# Patient Record
Sex: Male | Born: 1995 | Race: Black or African American | Hispanic: No | Marital: Single | Smoking: Never smoker
Health system: Southern US, Community
[De-identification: ages and names within clinical notes are randomized; demographics above are authoritative.]

---

## 2014-03-01 ENCOUNTER — Emergency Department (HOSPITAL_COMMUNITY)
Admission: EM | Admit: 2014-03-01 | Discharge: 2014-03-01 | Disposition: A | Discharge status: Home / Self Care | Payer: Medicaid Other | Attending: Emergency Medicine | Admitting: Emergency Medicine

## 2014-03-01 ENCOUNTER — Emergency Department (HOSPITAL_COMMUNITY): Payer: Medicaid Other

## 2014-03-01 ENCOUNTER — Encounter (HOSPITAL_COMMUNITY): Payer: Self-pay

## 2014-03-01 DIAGNOSIS — R079 Chest pain, unspecified: Secondary | ICD-10-CM | POA: Diagnosis present

## 2014-03-01 DIAGNOSIS — R0789 Other chest pain: Secondary | ICD-10-CM | POA: Diagnosis not present

## 2014-03-01 LAB — BASIC METABOLIC PANEL
ANION GAP: 13 (ref 5–15)
BUN: 8 mg/dL (ref 6–23)
CHLORIDE: 101 meq/L (ref 96–112)
CO2: 27 meq/L (ref 19–32)
Calcium: 9.7 mg/dL (ref 8.4–10.5)
Creatinine, Ser: 1.06 mg/dL (ref 0.50–1.35)
GFR calc Af Amer: >90 mL/min (ref >90)
GFR calc non Af Amer: >90 mL/min (ref >90)
GLUCOSE: 80 mg/dL (ref 70–99)
POTASSIUM: 4.2 meq/L (ref 3.7–5.3)
SODIUM: 141 meq/L (ref 137–147)

## 2014-03-01 LAB — CBC
HCT: 43.9 % (ref 39.0–52.0)
Hemoglobin: 15.1 g/dL (ref 13.0–17.0)
MCH: 30.4 pg (ref 26.0–34.0)
MCHC: 34.4 g/dL (ref 30.0–36.0)
MCV: 88.3 fL (ref 78.0–100.0)
PLATELETS: 247 10*3/uL (ref 150–400)
RBC: 4.97 MIL/uL (ref 4.22–5.81)
RDW: 12.4 % (ref 11.5–15.5)
WBC: 6.5 10*3/uL (ref 4.0–10.5)

## 2014-03-01 LAB — I-STAT TROPONIN, ED: TROPONIN I, POC: 0 ng/mL (ref 0.00–0.08)

## 2014-03-01 MED ORDER — KETOROLAC TROMETHAMINE 60 MG/2ML IM SOLN
30.0000 mg | Freq: Once | INTRAMUSCULAR | Status: AC
  Administered 2014-03-01: 30 mg via INTRAMUSCULAR
  Filled 2014-03-01: qty 2

## 2014-03-01 MED ORDER — METHOCARBAMOL 500 MG PO TABS
500.0000 mg | ORAL_TABLET | Freq: Once | ORAL | Status: AC
  Administered 2014-03-01: 500 mg via ORAL
  Filled 2014-03-01: qty 1

## 2014-03-01 MED ORDER — METHOCARBAMOL 500 MG PO TABS: 1000.0000 mg | ORAL_TABLET | Freq: Four times a day (QID) | ORAL | Status: DC | PRN

## 2014-03-01 NOTE — ED Notes (Addendum)
Pt. Reports woke up this AM with right sided right and chest pain with mild SOB. States pain increases with deep breathing. Denies radiation. Reports recent long distance travel in car. Pt. Does not appear in distress at triage. Alert and oriented x.4.

## 2014-03-01 NOTE — Discharge Instructions (Signed)
For pain control you may take up to 800mg  of Motrin (also known as ibuprofen). That is usually 4 over the counter pills,  3 times a day. Take with food to minimize stomach irritation   You can also take  tylenol (acetaminophen) 975mg  (this is 3 over the counter pills) four times a day. Do not drink alcohol or combine with other medications that have acetaminophen as an ingredient (Read the labels!).    For breakthrough pain you may take Robaxin. Do not drink alcohol, drive or operate heavy machinery when taking Robaxin.  Do not hesitate to return to the emergency room for any new, worsening or concerning symptoms.  Please obtain primary care using resource guide below. But the minute you were seen in the emergency room and that they will need to obtain records for further outpatient management.  Jack Roth.reso  Emergency Department Resource Guide 1) Find a Doctor and Pay Out of Pocket Although you won't have to find out who is covered by your insurance plan, it is a good idea to ask around and get recommendations. You will then need to call the office and see if the doctor you have chosen will accept you as a new patient and what types of options they offer for patients who are self-pay. Some doctors offer discounts or will set up payment plans for their patients who do not have insurance, but you will need to ask so you aren't surprised when you get to your appointment.  2) Contact Your Local Health Department Not all health departments have doctors that can see patients for sick visits, but many do, so it is worth a call to see if yours does. If you don't know where your local health department is, you can check in your phone book. The CDC also has a tool to help you locate your state's health department, and many state websites also have listings of all of their local health departments.  3) Find a Walk-in Clinic If your illness is not likely to be very severe or complicated, you may want to try a walk  in clinic. These are popping up all over the country in pharmacies, drugstores, and shopping centers. They're usually staffed by nurse practitioners or physician assistants that have been trained to treat common illnesses and complaints. They're usually fairly quick and inexpensive. However, if you have serious medical issues or chronic medical problems, these are probably not your best option.  No Primary Care Doctor: - Call Health Connect at  [phone] - they can help you locate a primary care doctor that  accepts your insurance, provides certain services, etc. - Physician Referral Service- [phone]  Chronic Pain Problems: Organization         Address  Phone   Notes  [facility]  [phone] Patients need to be referred by their primary care doctor.   Medication Assistance: Organization         Address  Phone   Notes  [facility] [address] [place], [place] [phone] --Must be a resident of [place] [facility] [place] -- Must have NO insurance coverage whatsoever (no Medicaid/ Medicare, etc.) -- The pt. MUST have a primary care doctor that directs their care regularly and follows them in the community   [facility]  [phone]   [employer]  [phone]    Agencies that provide inexpensive medical care: Retail buyerrganization         Address  Phone  Notes  °[facility]  ([phone]) [phone]   °[facility]    (336) 832-7272   °[facility] [address] °[place], [place] [address] (336) 832-4777   °[facility] [address], °[place] (336) 271-4999   °[facility]    (336) 373-0678   °[facility]    (336) 272-1050   °[facility] ° [address], [place] Phone:  (336) [phone], Fax:  (336) [phone] Hours of Operation:  9 am - 6 pm, M-F.  Also accepts Medicaid/Medicare and self-pay.  °[facility] ° [address], [place] Phone: (336) [phone], Fax: ([phone]) [phone]. Hours of Operation:  8:30 am - 5:30 pm, M-F.  Also accepts Medicaid and self-pay.  °[facility] [place] [address], [place] Phone: (336) [phone]   °[facility] [address], [place], [place] (336)723-1848, Ext. 123 Mondays & Thursdays: 7-9 AM.  First 15 patients are seen on a first come, first serve basis. °  ° °Medicaid-accepting [place] Providers: ° °Organization         Address  Phone   Notes  °[facility] [address], [place] ([phone]) [phone] Also accepts self-pay patients.  °[facility] [address], [place] ° (336) [phone]   °[facility] [address], [place] (336) [phone]   °[facility] [address] [place] [address], [place] (336) 299-7000   °Veita Bland [address], [place]  ° (336) [phone] Only accepts [place] Access Medicaid patients after they have their name applied to their card.  ° °Self-Pay (no insurance) in [place]: ° °Organization         Address  Phone   Notes  °Sickle Cell Patients, [facility] [address], [place] (336) 832-1970   °[facility] [address], [place] (336) 832-4400   °[facility] ° [address], [place] (336) 992-4800   °[facility]/Dr. Osei-Bonsu ° [address] [place] [address], [place] or [address], [place] (336) [phone] Phone number for both [place] and [place] locations is the same.  °[facility] [address], [place] (336) [phone]   °[facility] [address] [place] [address], [place] or [address] (336) 852-7530 °(336) 878-2260   °[facility] [address],  (336) 350-1642, phone; (336) [phone], fax Sees  patients 1st and 3rd Saturday of every month.  Must not qualify for public or private insurance (i.e. Medicaid, Medicare, [place] Health Choice, Veterans' Benefits) • Household income should be no more than 200% of the poverty level •The clinic cannot treat you if you are pregnant or think you are pregnant • Sexually transmitted diseases are not treated at the clinic.  ° ° °Dental Care: °Organization         Address  Phone  Notes  °[place] [facility] [address],  (336) [phone] Accepts children up to age 21 who are enrolled in Medicaid or [place] Health Choice; pregnant women with a Medicaid card; and children who have applied for Medicaid or [place] Health Choice, but were declined, whose parents can pay a reduced fee at time of service.  °[place]   Department of [facility]  [address], [place] [phone] Accepts children up to age 36 who are enrolled in [place] or [place]; pregnant women with a Medicaid card; and children who have applied for Medicaid or [place] Health Choice, but were declined, whose parents can pay a reduced fee at time of service.  [facility]  [place] [phone] Patients are seen by appointment only. Walk-ins are not accepted. [place] will see patients 32 years of age and older. Monday - Tuesday (8am-5pm) Most Wednesdays (8:30-5pm) $30 per visit, cash only  [facility]  [address], [facility] [phone] Patients are seen by appointment only. Walk-ins are not accepted. [place] will see patients 82 years of age and older. One Wednesday Evening (Monthly: Volunteer Based).  $30 per visit, cash only  [place]  [phone] for adults; Children under age 20, call Graduate Pediatric Dentistry at [phone]. Children aged 74-14, please call [phone] to request a  pediatric application.  Dental services are provided in all areas of dental care including fillings, crowns and bridges, complete and partial dentures, implants, gum treatment, root canals, and extractions. Preventive care is also provided. Treatment is provided to both adults and children. Patients are selected via a lottery and there is often a waiting list.   [facility] [address], [place]  [phone] [email or web address]   [facility] [address] [place], [place] [phone], Ext. 123 Second and Fourth Thursday of each month, opens at 6:30 AM; Clinic ends at 9 AM.  Patients are seen on a first-come first-served basis, and a limited number are seen during each clinic.   [facility]  [address] Hillard Danker [place], [place] [phone]   Eligibility Requirements You must have lived in [place], [place], or [place] counties for at least the last three months.   You cannot be eligible for state or federal sponsored [employer], including [employer], [place], or [employer].   You generally cannot be eligible for healthcare insurance through your employer.    How to apply: Eligibility screenings are held every Tuesday and Wednesday afternoon from 1:00 pm until 4:00 pm. You do not need an appointment for the interview!  [facility] [address], [place], [place]   [place]  [place] Department  [place]  [phone]    Behavioral Health Resources in the Community: Intensive Outpatient Programs Organization         Address  Phone  Notes  [facility] [address], [place], [place] [phone]   [facility] [address] [place], [place]   [facility] [address] [place], [place]   [place] [address],  [place], [place] or (217)[phone]   Substance Abuse Resources Organization         Address  Phone  Notes  [facility]  [phone]   [place]  [phone]   [facility] [place]   Chinita Pester  [phone]   Residential & Outpatient Substance Abuse Program  (479)[phone]   Psychological Services Organization         Address  Phone  Notes  [facility]  336- [phone]   °[facility]  336- 378-7881   °[facility] [address], [place] 1-800-853-5163 or 336-641-4981   ° °Mobile Crisis Teams °Organization         Address  Phone  Notes  °[facility], Mobile Crisis Care Unit  1-877-626-1772   °[facility]ive °[facility] ° [address] [place], [place] 336-834-9664   °Sharon DeEsch [address] °[place] 336-554-5454   ° °Self-Help/Support Groups °Organization         Address  Phone             Notes  °[facility] [place] - variety of support groups  336- 373-1402 Call for more information  °Narcotics Anonymous (NA), [facility] [address], °[place]  2 meetings at this location  ° °Residential Treatment Programs °Organization         Address  Phone  Notes  °[facility] [address],    °[place]  1-866-801-8205   °[facility] ° [address], [place], [place] [phone]   °[facility] [address], [place] [phone] Admissions: 8am-3pm M-F  °[facility] [address]    °[place], [place] 336-841-1104   °[facility] [address], [place], [place] 336-379-7146   °[facility] [address]  °[place], [place] 336-285-9073   °[facility] - Intensive Outpatient [address], [place], [place] 336-852-3033   °[facility] [address]    °[place], [place] 1-877-615-2722 or 336-784-9470   °[facility] ([facility]) [address] [place], [place] 336-227-7417 Accepts Medicaid  °[facility] [address]  °[place] 1-800-659-3381 Substance Abuse/Addiction Treatment  ° °[place] Behavioral Health Resources °Organization         Address  Phone  Notes  °[facility]  (888) 581-9988   °Julie Brannon, PhD [address] [place], [place]   (336) 349-5553 or (336) 951-0000   °[facility]   [address] °[place], [place] (336) 349-4454   °[facility] [address], [place], [place] (336) 342-8316 Insurance/Medicaid/sponsorship through [facility]  °[facility] [address]                                    [place], [place] (336) 342-8316 Therapy/tele-psych/case  °[facility] [address]  ° [place], [place] (336) 349-2233    °Dr. Arfeen  (336) 349-4544   °[facility] [place]  [employer] [place] [facility] 1) [address], [place] °2) [address], [place] °3)  [address], [place] (336) 349-3220 °(336) 342-7768 ° °(336) 342-8140   °[place] Child Abuse Hotline (336) 342-1394 or (336) 342-3537 (After Hours)    ° ° ° ° °

## 2014-03-01 NOTE — ED Provider Notes (Signed)
CSN: [id number]     Arrival date & time 03/01/14  1552 History  This chart was scribed for Wynetta EmeryNicole Marguita Venning, PA-C, working with Elwin MochaBlair Walden, MD found by Elon SpannerGarrett Cook, ED Scribe. This patient was seen in room TR09C/TR09C and the patient's care was started at 5:00 PM.   Chief Complaint  Patient presents with  . Chest Pain   The history is provided by the patient. No language interpreter was used.   HPI Comments: Jack Roth is a 18 y.o. male who presents to the Emergency Department complaining of 7/10 central and right lateral chest pain with inspiration and expiration only onset today.    Patient reports several years of chronic nasal congestion for which he has taken a nasal spray.  Patient denies taking any other medications.  Patient denies history of similar episodes.  Patient denies history of PE, DVT.  Patient denies recent prolonged immobilization (note that this contradicts triage note, have confirmed with the patient he specifically denies recent immobilization including surgeries, long trips.).  Patient denies leg swelling/calf pain.  Patient denies new activity including heavy lifting.   Patient denies family family history of early cardiac death. Patient denies cough, fever, chills, SOB, sore througth.     History reviewed. No pertinent past medical history. History reviewed. No pertinent past surgical history. History reviewed. No pertinent family history. History  Substance Use Topics  . Smoking status: Never Smoker   . Smokeless tobacco: Not on file  . Alcohol Use: No    Review of Systems A complete 10 system review of systems was obtained and all systems are negative except as noted in the HPI and PMH.   Allergies  Review of patient's allergies indicates no known allergies.  Home Medications   Prior to Admission medications   Medication Sig Start Date End Date Taking? Authorizing Provider  methocarbamol (ROBAXIN) 500 MG tablet Take 2 tablets (1,000 mg total)  by mouth 4 (four) times daily as needed (Pain). 03/01/14   Alizee Maple, PA-C   BP 97/60 mmHg  Pulse 77  Temp(Src) 98.4 F (36.9 C) (Oral)  Resp 16  SpO2 100% Physical Exam  Constitutional: He is oriented to person, place, and time. He appears well-developed and well-nourished. No distress.  HENT:  Head: Normocephalic and atraumatic.  Eyes: Conjunctivae and EOM are normal.  Neck: Neck supple. No tracheal deviation present.  Cardiovascular: Normal rate.   Pulmonary/Chest: Effort normal and breath sounds normal. No stridor. No respiratory distress. He has no wheezes. He has no rales. He exhibits tenderness.    Abdominal: Soft.  Musculoskeletal: Normal range of motion.  No calf asymmetry, superficial collaterals, palpable cords, edema, Homans sign negative bilaterally.    Neurological: He is alert and oriented to person, place, and time.  Skin: Skin is warm and dry.  Psychiatric: He has a normal mood and affect. His behavior is normal.  Nursing note and vitals reviewed.   ED Course  Procedures (including critical care time)  DIAGNOSTIC STUDIES: Oxygen Saturation is 100% on RA, normal by my interpretation.    COORDINATION OF CARE:  5:33 PM Discussed treatment plan with patient at bedside.  Patient acknowledges and agrees with plan.    Labs Review Labs Reviewed  CBC  BASIC METABOLIC PANEL  Rosezena SensorI-STAT TROPOININ, ED    Imaging Review Dg Chest 2 View  03/01/2014   CLINICAL DATA:  Right-sided chest pain for 1 day.  EXAM: CHEST  2 VIEW  COMPARISON:  None.  FINDINGS: The heart size and  mediastinal contours are within normal limits. Both lungs are clear. The visualized skeletal structures are unremarkable.  IMPRESSION: No active cardiopulmonary disease.   Electronically Signed   By: Herbie BaltimoreWalt  Liebkemann M.D.   On: 03/01/2014 18:17     EKG Interpretation None      MDM   Final diagnoses:  Chest wall pain    Filed Vitals:   03/01/14 1605 03/01/14 1835  BP: 108/72 97/60   Pulse: 81 77  Temp: 98.4 F (36.9 C)   TempSrc: Oral   Resp: 18 16  SpO2: 100% 100%    Medications  ketorolac (TORADOL) injection 30 mg (30 mg Intramuscular Given 03/01/14 1716)  methocarbamol (ROBAXIN) tablet 500 mg (500 mg Oral Given 03/01/14 1716)    Jack Roth is a pleasant 18 y.o. male presenting with pleuritic chest pain, chest pain is also reproducible to palpation. Patient is not tachycardic, he is saturating well on room air, patient is low risk by Wells criteria and PERC negative. Doubt PE. Blood work unremarkable, EKG with no arrhythmia, chest x-ray also without acute abnormality. Will treat with Robaxin and patient will be given Toradol shot in the ED.  Evaluation does not show pathology that would require ongoing emergent intervention or inpatient treatment. Pt is hemodynamically stable and mentating appropriately. Discussed findings and plan with patient/guardian, who agrees with care plan. All questions answered. Return precautions discussed and outpatient follow up given.   Discharge Medication List as of 03/01/2014  6:31 PM    START taking these medications   Details  methocarbamol (ROBAXIN) 500 MG tablet Take 2 tablets (1,000 mg total) by mouth 4 (four) times daily as needed (Pain)., Starting 03/01/2014, Until Discontinued, Print         I personally performed the services described in this documentation, which was scribed in my presence. The recorded information has been reviewed and is accurate.   Wynetta Emeryicole Taunia Frasco, PA-C 03/02/14 0119  Elwin MochaBlair Walden, MD 03/02/14 2135

## 2014-03-01 NOTE — ED Notes (Signed)
Pt. Stated only hurts my chest when I inhale

## 2016-04-06 IMAGING — CR DG CHEST 2V
2 series · 2 of 2 positions shown · non-contrast
Comparison: None.

CLINICAL DATA: Right-sided chest pain for 1 day.

EXAM:
CHEST  2 VIEW

[chest pa]
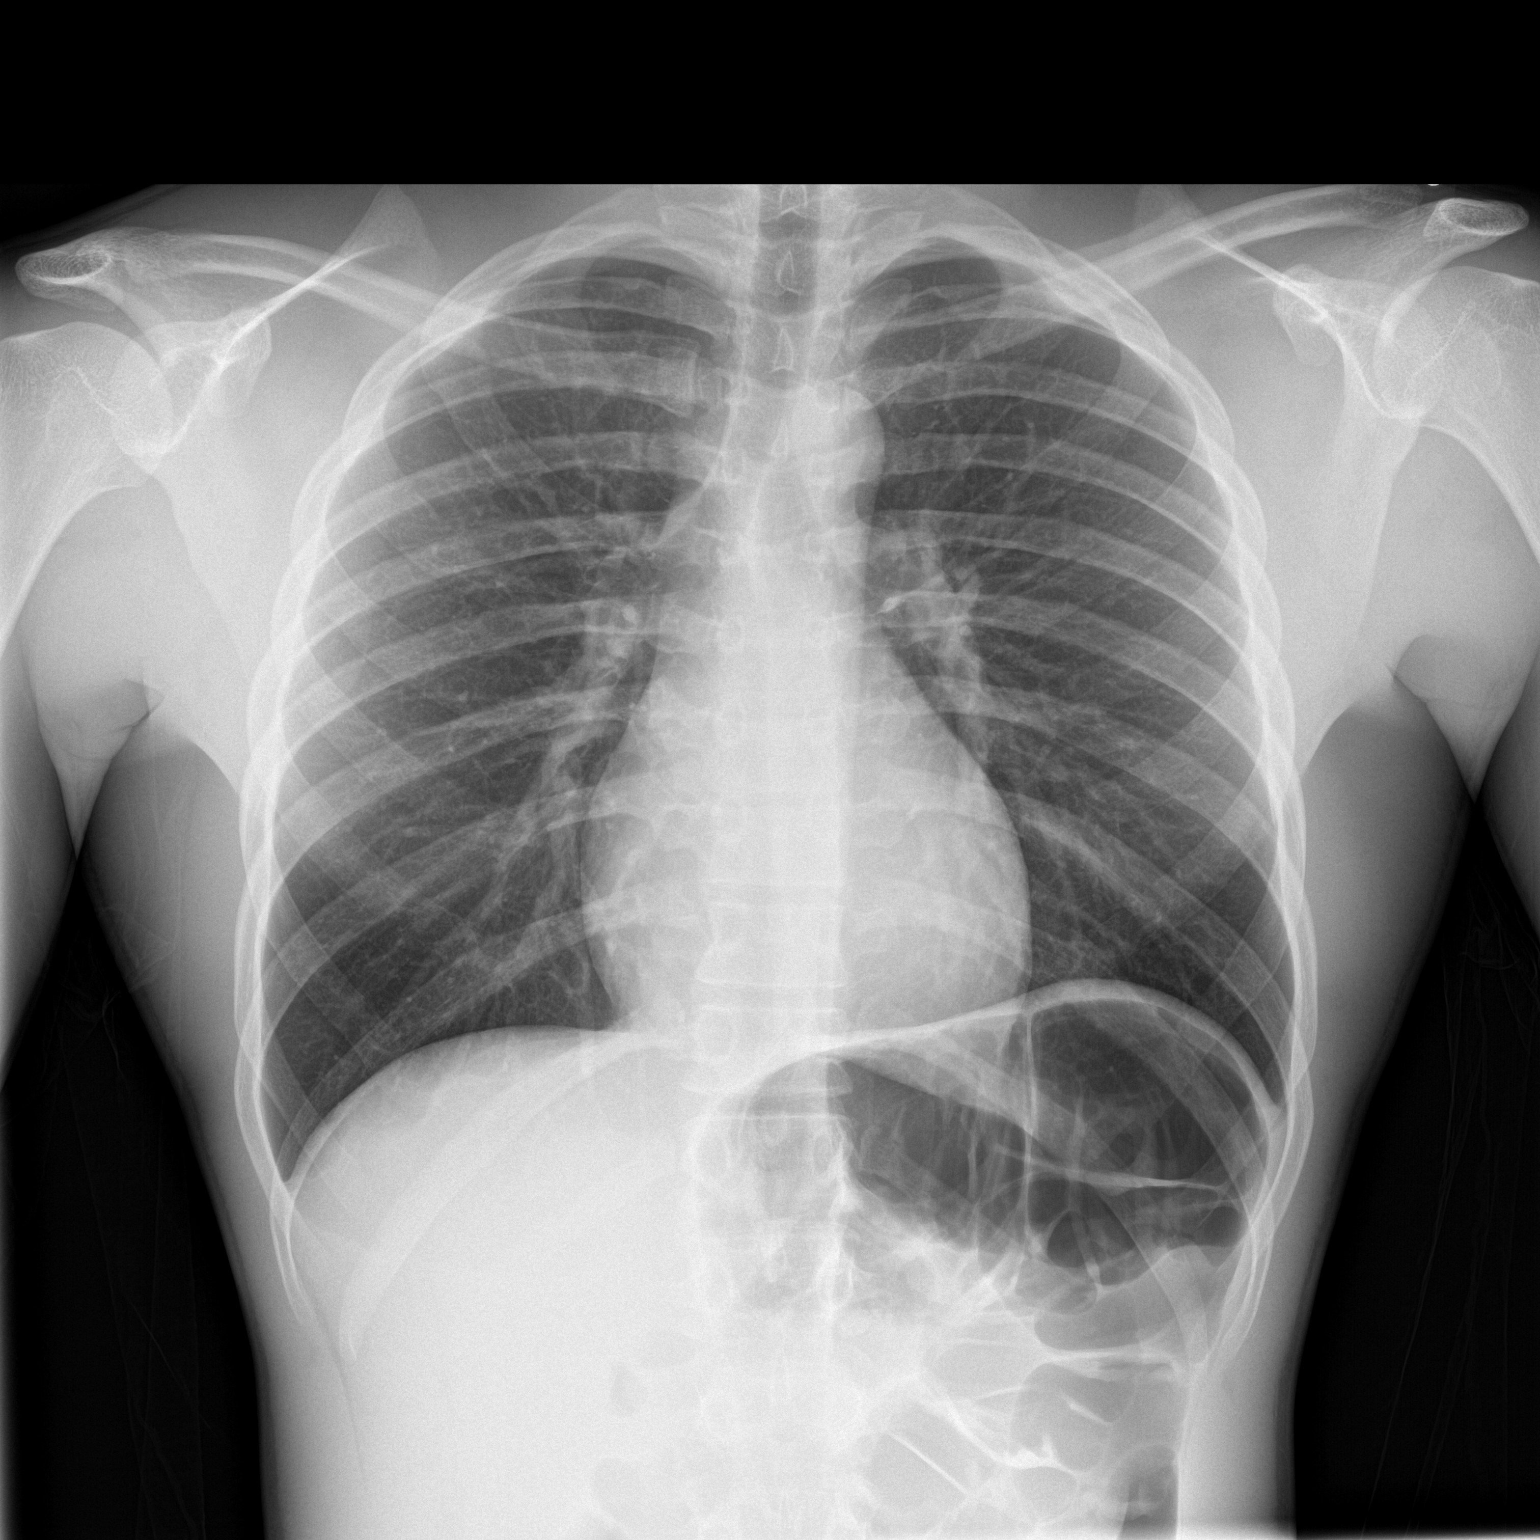

[chest lat]
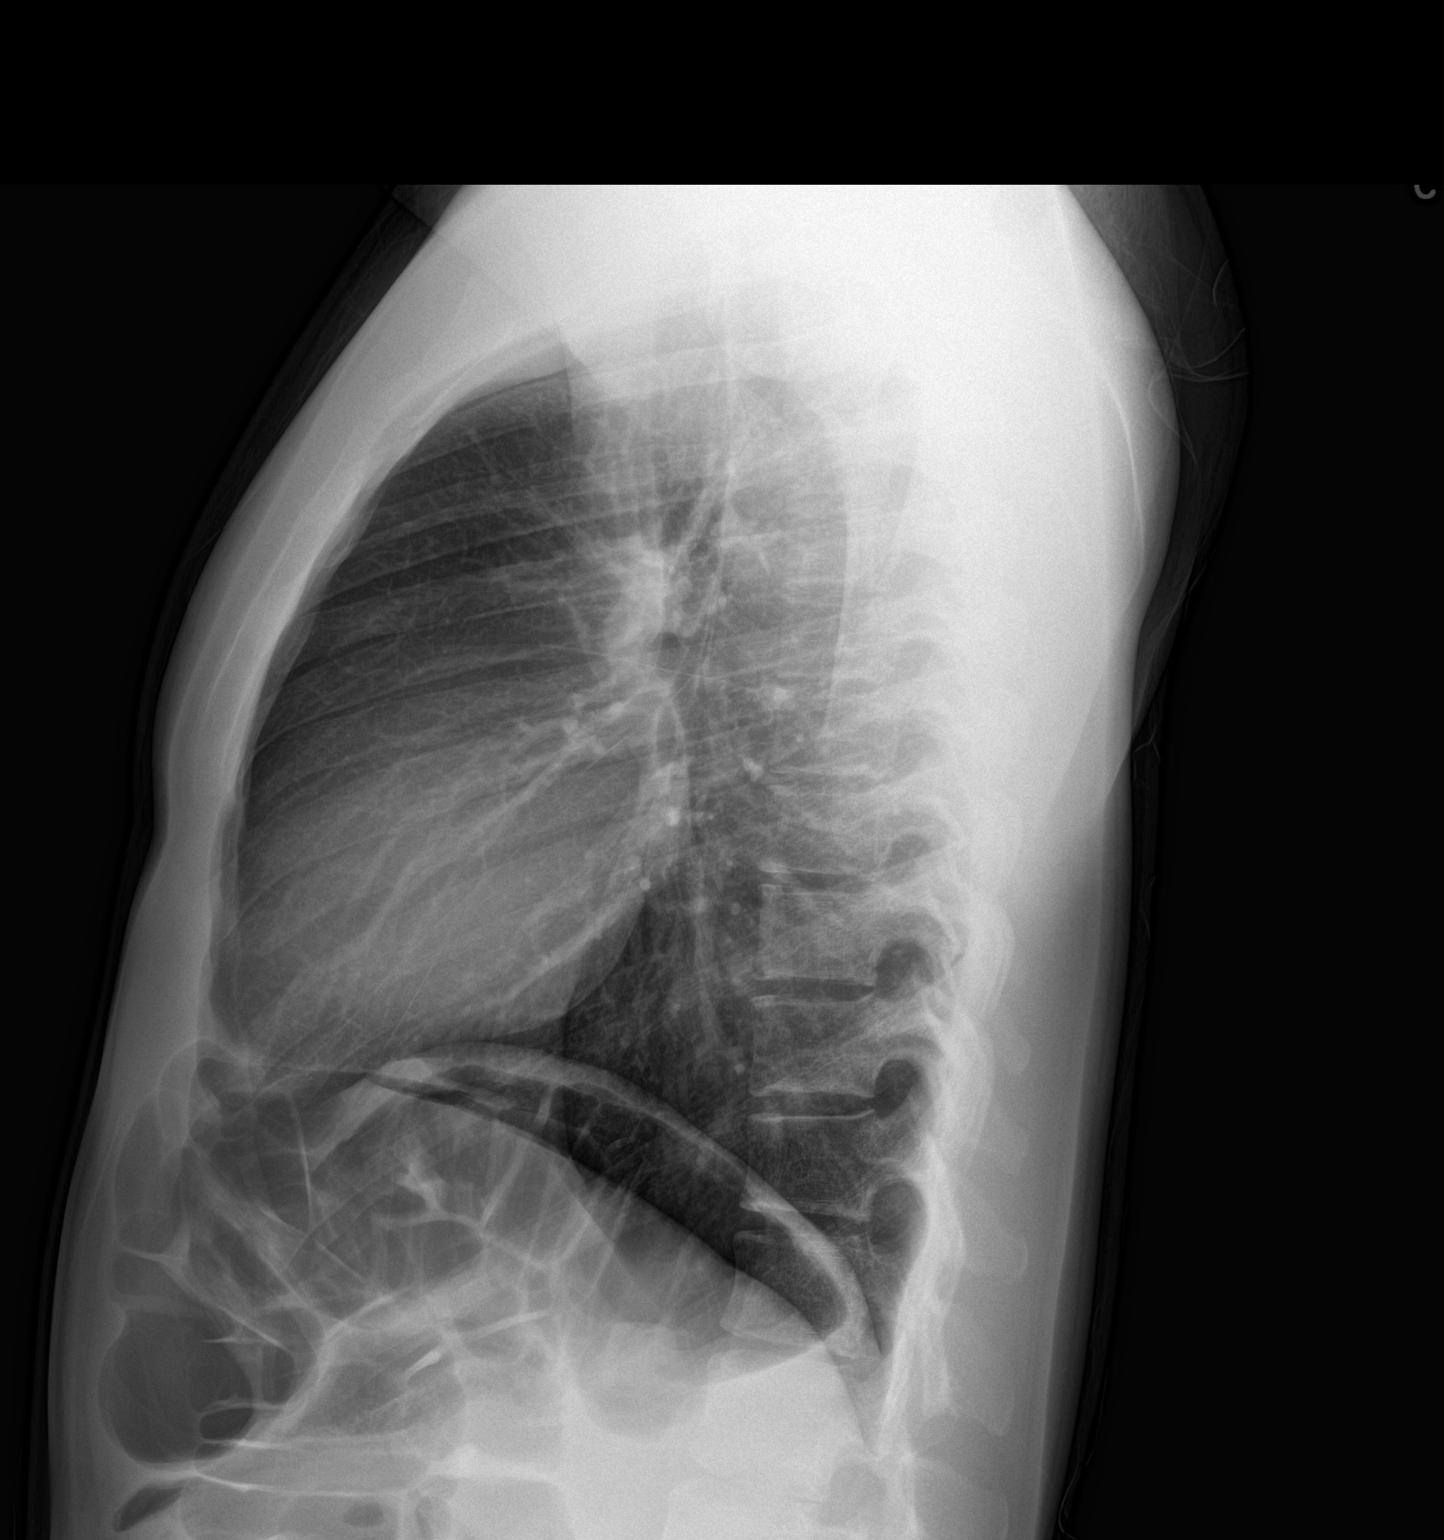

[2 of 2 positions shown; findings below may reference images not displayed]

FINDINGS: The heart size and mediastinal contours are within normal limits.
Both lungs are clear. The visualized skeletal structures are
unremarkable.
IMPRESSION: No active cardiopulmonary disease.

## 2016-12-26 ENCOUNTER — Ambulatory Visit (HOSPITAL_COMMUNITY)
Admission: EM | Admit: 2016-12-26 | Discharge: 2016-12-26 | Disposition: A | Discharge status: Home / Self Care | Payer: No Typology Code available for payment source | Attending: Family Medicine | Admitting: Family Medicine

## 2016-12-26 ENCOUNTER — Encounter (HOSPITAL_COMMUNITY): Payer: Self-pay | Admitting: Emergency Medicine

## 2016-12-26 DIAGNOSIS — J40 Bronchitis, not specified as acute or chronic: Secondary | ICD-10-CM | POA: Diagnosis not present

## 2016-12-26 MED ORDER — AZITHROMYCIN 250 MG PO TABS
250.0000 mg | ORAL_TABLET | Freq: Every day | ORAL | 0 refills | Status: AC
Start: 2016-12-26 — End: ?

## 2016-12-26 MED ORDER — CETIRIZINE-PSEUDOEPHEDRINE ER 5-120 MG PO TB12
1.0000 | ORAL_TABLET | Freq: Every day | ORAL | 0 refills | Status: AC
Start: 2016-12-26 — End: ?

## 2016-12-26 MED ORDER — FLUTICASONE PROPIONATE 50 MCG/ACT NA SUSP: 2.0000 | Freq: Every day | NASAL | 0 refills | Status: AC

## 2016-12-26 NOTE — Discharge Instructions (Signed)
Azithromycin as directed. Start flonase, zyrtec-D for nasal congestion. You can use over the counter nasal saline rinse such as neti pot for nasal congestion. Keep hydrated, your urine should be clear to pale yellow in color. Tylenol/motrin for fever and pain. Monitor for any worsening of symptoms, chest pain, shortness of breath, wheezing, swelling of the throat, follow up for reevaluation.

## 2016-12-26 NOTE — ED Triage Notes (Signed)
Cough for 3-4 weeks.  Noticed cold chills today.  Patient had a runny nose last week and also had head congestion

## 2016-12-26 NOTE — ED Provider Notes (Signed)
[facility]    CSN: [id number] Arrival date & time: 12/26/16  1027     History   Chief Complaint Chief Complaint  Patient presents with  . URI    HPI Jack Roth is a 21 y.o. male.   21 year old healthy male comes in for 3 week history of cough. States it started with sinus symptoms of congestion, headache. He has been taking otc medication with little relief. He now has had a dry cough for the past 3 weeks that is worse at night. He started having chills today and came in to be seen. Denies fever. Continues to have some congestion. Denies ear pain, eye pain, sore throat. Denies chest pain, shortness of breath, wheezing. He is a current some day smoker for the past 2 years and have had to decrease in amount due to his symptoms. Positive sick contact.       History reviewed. No pertinent past medical history.  There are no active problems to display for this patient.   History reviewed. No pertinent surgical history.     Home Medications    Prior to Admission medications   Medication Sig Start Date End Date Taking? Authorizing Provider  OVER THE COUNTER MEDICATION otc allergy medicine   Yes [provider]  azithromycin (ZITHROMAX) 250 MG tablet Take 1 tablet (250 mg total) by mouth daily. Take first 2 tablets together, then 1 every day until finished. 12/26/16   Cathie Hoops, Amy V, PA-C  cetirizine-pseudoephedrine (ZYRTEC-D) 5-120 MG tablet Take 1 tablet by mouth daily. 12/26/16   Cathie Hoops, Amy V, PA-C  fluticasone (FLONASE) 50 MCG/ACT nasal spray Place 2 sprays into both nostrils daily. 12/26/16   Belinda Fisher, PA-C    Family History No family history on file.  Social History Social History  Substance Use Topics  . Smoking status: Never Smoker  . Smokeless tobacco: Not on file  . Alcohol use No     Allergies   Patient has no known allergies.   Review of Systems Review of Systems  Reason unable to perform ROS: See HPI as above.     Physical  Exam Triage Vital Signs ED Triage Vitals  Enc Vitals Group     BP 12/26/16 1106 124/78     Pulse Rate 12/26/16 1106 78     Resp 12/26/16 1106 18     Temp 12/26/16 1106 98.9 F (37.2 C)     Temp Source 12/26/16 1106 Oral     SpO2 12/26/16 1106 100 %     Weight --      Height --      Head Circumference --      Peak Flow --      Pain Score 12/26/16 1104 6     Pain Loc --      Pain Edu? --      Excl. in GC? --    No data found.   Updated Vital Signs BP 124/78 (BP Location: Left Arm)   Pulse 78   Temp 98.9 F (37.2 C) (Oral)   Resp 18   SpO2 100%   Physical Exam  Constitutional: He is oriented to person, place, and time. He appears well-developed and well-nourished. No distress.  HENT:  Head: Normocephalic and atraumatic.  Right Ear: External ear and ear canal normal. Tympanic membrane is erythematous. Tympanic membrane is not bulging.  Left Ear: External ear and ear canal normal. Tympanic membrane is not erythematous and not bulging.  Nose: Mucosal edema and rhinorrhea  present. Right sinus exhibits no maxillary sinus tenderness and no frontal sinus tenderness. Left sinus exhibits no maxillary sinus tenderness and no frontal sinus tenderness.  Mouth/Throat: Uvula is midline, oropharynx is clear and moist and mucous membranes are normal.  Eyes: Pupils are equal, round, and reactive to light. Conjunctivae are normal.  Neck: Normal range of motion. Neck supple.  Cardiovascular: Normal rate, regular rhythm and normal heart sounds.  Exam reveals no gallop and no friction rub.   No murmur heard. Pulmonary/Chest: Effort normal and breath sounds normal. He has no decreased breath sounds. He has no wheezes. He has no rhonchi. He has no rales.  Lymphadenopathy:    He has no cervical adenopathy.  Neurological: He is alert and oriented to person, place, and time.  Skin: Skin is warm and dry.  Psychiatric: He has a normal mood and affect. His behavior is normal. Judgment normal.      UC Treatments / Results  Labs (all labs ordered are listed, but only abnormal results are displayed) Labs Reviewed - No data to display  EKG  EKG Interpretation None       Radiology No results found.  Procedures Procedures (including critical care time)  Medications Ordered in UC Medications - No data to display   Initial Impression / Assessment and Plan / UC Course  I have reviewed the triage vital signs and the nursing notes.  Pertinent labs & imaging results that were available during my care of the patient were reviewed by me and considered in my medical decision making (see chart for details).    Azithromycin as directed. Symptomatic treatment as needed. Push fluids. Return precautions given.   Final Clinical Impressions(s) / UC Diagnoses   Final diagnoses:  Bronchitis    New Prescriptions New Prescriptions   AZITHROMYCIN (ZITHROMAX) 250 MG TABLET    Take 1 tablet (250 mg total) by mouth daily. Take first 2 tablets together, then 1 every day until finished.   CETIRIZINE-PSEUDOEPHEDRINE (ZYRTEC-D) 5-120 MG TABLET    Take 1 tablet by mouth daily.   FLUTICASONE (FLONASE) 50 MCG/ACT NASAL SPRAY    Place 2 sprays into both nostrils daily.      Belinda Fisher, PA-C 12/26/16 1149

## 2017-01-18 ENCOUNTER — Encounter (HOSPITAL_COMMUNITY): Payer: Self-pay | Admitting: Emergency Medicine

## 2017-01-18 ENCOUNTER — Ambulatory Visit (HOSPITAL_COMMUNITY)
Admission: EM | Admit: 2017-01-18 | Discharge: 2017-01-18 | Disposition: A | Discharge status: Home / Self Care | Payer: No Typology Code available for payment source | Attending: Emergency Medicine | Admitting: Emergency Medicine

## 2017-01-18 DIAGNOSIS — R05 Cough: Secondary | ICD-10-CM

## 2017-01-18 DIAGNOSIS — R059 Cough, unspecified: Secondary | ICD-10-CM

## 2017-01-18 MED ORDER — BENZONATATE 100 MG PO CAPS: 100.0000 mg | ORAL_CAPSULE | Freq: Three times a day (TID) | ORAL | 0 refills | Status: AC

## 2017-01-18 NOTE — ED Triage Notes (Signed)
Pt here for persistent prod cough.... Seen here on 10/03  Was given z-pack, Flonase w/temporary relief.   A&O x4... NAD... Ambulatory

## 2017-01-18 NOTE — ED Provider Notes (Signed)
[facility]    CSN: [id number] Arrival date & time: 01/18/17  1810     History   Chief Complaint Chief Complaint  Patient presents with  . Cough    HPI Wandra ArthursChristopher Virella is a 21 y.o. male.   Cristal DeerChristopher presents with his friend with complaints of cough which started about 1 week ago. He had been treated with antibiotics and flonase after visit on 10/3. This improved symptoms. His cough has returned however. Mild runny nose. Yesterday had sore throat, which has improved. Without shortness of breath, gi/gu complaints, known ill contacts, fevers or ear pain. Cough is minimally productive. Without history of asthma. He smoke black and milds approximately twice a week. Has not taken any medications for his symptoms.    ROS per HPI.       History reviewed. No pertinent past medical history.  There are no active problems to display for this patient.   History reviewed. No pertinent surgical history.     Home Medications    Prior to Admission medications   Medication Sig Start Date End Date Taking? Authorizing Provider  azithromycin (ZITHROMAX) 250 MG tablet Take 1 tablet (250 mg total) by mouth daily. Take first 2 tablets together, then 1 every day until finished. 12/26/16   Cathie HoopsYu, Amy V, PA-C  benzonatate (TESSALON) 100 MG capsule Take 1 capsule (100 mg total) by mouth every 8 (eight) hours. 01/18/17   Georgetta HaberBurky, Natalie B, NP  cetirizine-pseudoephedrine (ZYRTEC-D) 5-120 MG tablet Take 1 tablet by mouth daily. 12/26/16   Cathie HoopsYu, Amy V, PA-C  fluticasone (FLONASE) 50 MCG/ACT nasal spray Place 2 sprays into both nostrils daily. 12/26/16   Belinda FisherYu, Amy V, PA-C  OVER THE COUNTER MEDICATION otc allergy medicine    [provider]    Family History History reviewed. No pertinent family history.  Social History Social History  Substance Use Topics  . Smoking status: Never Smoker  . Smokeless tobacco: Never Used  . Alcohol use No     Allergies   Patient has no  known allergies.   Review of Systems Review of Systems   Physical Exam Triage Vital Signs ED Triage Vitals  Enc Vitals Group     BP 01/18/17 1819 98/61     Pulse Rate 01/18/17 1819 93     Resp 01/18/17 1819 20     Temp 01/18/17 1819 98.4 F (36.9 C)     Temp Source 01/18/17 1819 Oral     SpO2 01/18/17 1819 99 %     Weight --      Height --      Head Circumference --      Peak Flow --      Pain Score 01/18/17 1821 4     Pain Loc --      Pain Edu? --      Excl. in GC? --    No data found.   Updated Vital Signs BP 98/61 (BP Location: Right Arm)   Pulse 93   Temp 98.4 F (36.9 C) (Oral)   Resp 20   SpO2 99%   Visual Acuity Right Eye Distance:   Left Eye Distance:   Bilateral Distance:    Right Eye Near:   Left Eye Near:    Bilateral Near:     Physical Exam  Constitutional: He is oriented to person, place, and time. He appears well-developed and well-nourished. No distress.  HENT:  Head: Normocephalic and atraumatic.  Right Ear: Tympanic membrane, external ear and ear  canal normal.  Left Ear: Tympanic membrane, external ear and ear canal normal.  Nose: Mucosal edema and rhinorrhea present. Right sinus exhibits no maxillary sinus tenderness and no frontal sinus tenderness. Left sinus exhibits no maxillary sinus tenderness and no frontal sinus tenderness.  Mouth/Throat: Uvula is midline, oropharynx is clear and moist and mucous membranes are normal. No tonsillar exudate.  Cardiovascular: Normal rate and regular rhythm.   Pulmonary/Chest: Effort normal and breath sounds normal. No respiratory distress. He has no wheezes. He exhibits no tenderness.  Without cough throughout entire exam  Musculoskeletal: Normal range of motion.  Lymphadenopathy:    He has no cervical adenopathy.  Neurological: He is alert and oriented to person, place, and time.  Skin: Skin is warm and dry. No rash noted.  Vitals reviewed.    UC Treatments / Results  Labs (all labs ordered  are listed, but only abnormal results are displayed) Labs Reviewed - No data to display  EKG  EKG Interpretation None       Radiology No results found.  Procedures Procedures (including critical care time)  Medications Ordered in UC Medications - No data to display   Initial Impression / Assessment and Plan / UC Course  I have reviewed the triage vital signs and the nursing notes.  Pertinent labs & imaging results that were available during my care of the patient were reviewed by me and considered in my medical decision making (see chart for details).     Without acute findings on exam, vitals stable, afebrile without hypoxia or tachypnea. Non distressed, non toxic in appearance. Consistent with viral illness at this time. Tessalon as needed for cough. Push fluids. mucinex as needed for symptoms. flonase may still be helpful. Discussed symptoms to return to be seen. If symptoms worsen or do not improve in the next week to return to be seen or to follow up with PCP. Patient verbalized understanding and agreeable to plan.    Georgetta Haber, NP 01/18/2017 6:47 PM   Final Clinical Impressions(s) / UC Diagnoses   Final diagnoses:  Cough    New Prescriptions New Prescriptions   BENZONATATE (TESSALON) 100 MG CAPSULE    Take 1 capsule (100 mg total) by mouth every 8 (eight) hours.     Controlled Substance Prescriptions [place] Controlled Substance Registry consulted? Not Applicable   Georgetta Haber, NP 01/18/17 [phone]

## 2017-01-18 NOTE — Discharge Instructions (Signed)
Push fluids to ensure adequate hydration. May try to cough medication I have prescribed. Over the counter mucinex may be helpful. Avoid smoking as this can exacerbate cough. If you develop increase shortness of breath, chest pain or fevers please return to be seen.

## 2017-12-02 ENCOUNTER — Encounter (HOSPITAL_COMMUNITY): Payer: Self-pay | Admitting: Emergency Medicine

## 2017-12-02 ENCOUNTER — Ambulatory Visit (HOSPITAL_COMMUNITY)
Admission: EM | Admit: 2017-12-02 | Discharge: 2017-12-02 | Disposition: A | Discharge status: Home / Self Care | Payer: No Typology Code available for payment source | Attending: Family Medicine | Admitting: Family Medicine

## 2017-12-02 DIAGNOSIS — Z113 Encounter for screening for infections with a predominantly sexual mode of transmission: Secondary | ICD-10-CM | POA: Insufficient documentation

## 2017-12-02 DIAGNOSIS — F1210 Cannabis abuse, uncomplicated: Secondary | ICD-10-CM | POA: Insufficient documentation

## 2017-12-02 DIAGNOSIS — Z202 Contact with and (suspected) exposure to infections with a predominantly sexual mode of transmission: Secondary | ICD-10-CM | POA: Insufficient documentation

## 2017-12-02 NOTE — ED Notes (Signed)
Urine specimen obtained while patient in lobby. Urine specimen in lab 

## 2017-12-02 NOTE — ED Provider Notes (Signed)
[facility]    CSN: [id number] Arrival date & time: 12/02/17  1206     History   Chief Complaint Chief Complaint  Patient presents with  . SEXUALLY TRANSMITTED DISEASE    HPI Jack Roth is a 22 y.o. male.   Jack Roth presents with requests for std screening. States he just wants to make sure all is well. No specific known exposures to STDs. No penile discharge, itching, redness, sores, lesions or urinary symptoms. Has 1 partner and uses condoms. States he would also like fertility testing and annual physical. Without contributing medical history.      ROS per HPI.      History reviewed. No pertinent past medical history.  There are no active problems to display for this patient.   History reviewed. No pertinent surgical history.     Home Medications    Prior to Admission medications   Medication Sig Start Date End Date Taking? Authorizing Provider  azithromycin (ZITHROMAX) 250 MG tablet Take 1 tablet (250 mg total) by mouth daily. Take first 2 tablets together, then 1 every day until finished. 12/26/16   Cathie Hoops, Amy V, PA-C  benzonatate (TESSALON) 100 MG capsule Take 1 capsule (100 mg total) by mouth every 8 (eight) hours. 01/18/17   Georgetta Haber, NP  cetirizine-pseudoephedrine (ZYRTEC-D) 5-120 MG tablet Take 1 tablet by mouth daily. 12/26/16   Cathie Hoops, Amy V, PA-C  fluticasone (FLONASE) 50 MCG/ACT nasal spray Place 2 sprays into both nostrils daily. 12/26/16   Belinda Fisher, PA-C  OVER THE COUNTER MEDICATION otc allergy medicine    [provider]    Family History No family history on file.  Social History Social History   Tobacco Use  . Smoking status: Never Smoker  . Smokeless tobacco: Never Used  Substance Use Topics  . Alcohol use: No  . Drug use: Yes    Types: Marijuana     Allergies   Patient has no known allergies.   Review of Systems Review of Systems   Physical Exam Triage Vital Signs ED Triage Vitals [12/02/17  1223]  Enc Vitals Group     BP (!) 103/58     Pulse Rate 77     Resp 16     Temp 97.8 F (36.6 C)     Temp src      SpO2 100 %     Weight      Height      Head Circumference      Peak Flow      Pain Score      Pain Loc      Pain Edu?      Excl. in GC?    No data found.  Updated Vital Signs BP (!) 103/58   Pulse 77   Temp 97.8 F (36.6 C)   Resp 16   SpO2 100%   Visual Acuity Right Eye Distance:   Left Eye Distance:   Bilateral Distance:    Right Eye Near:   Left Eye Near:    Bilateral Near:     Physical Exam  Constitutional: He is oriented to person, place, and time. He appears well-developed and well-nourished.  Cardiovascular: Normal rate and regular rhythm.  Pulmonary/Chest: Effort normal and breath sounds normal.  Abdominal: Soft. There is no tenderness. There is no rigidity, no rebound, no guarding, no CVA tenderness, no tenderness at McBurney's point and negative Murphy's sign.  Denies scrotal redness, swelling, pain; denies sores or lesions; gu exam deferred  Genitourinary:  Genitourinary Comments: Denies sores, lesions, ng; no pelvic pain; gu exam deferred at this time, urine cytology collected    Neurological: He is alert and oriented to person, place, and time.  Skin: Skin is warm and dry.     UC Treatments / Results  Labs (all labs ordered are listed, but only abnormal results are displayed) Labs Reviewed  URINE CYTOLOGY ANCILLARY ONLY    EKG None  Radiology No results found.  Procedures Procedures (including critical care time)  Medications Ordered in UC Medications - No data to display  Initial Impression / Assessment and Plan / UC Course  I have reviewed the triage vital signs and the nursing notes.  Pertinent labs & imaging results that were available during my care of the patient were reviewed by me and considered in my medical decision making (see chart for details).     Encouraged establishment with PCP for annual physical  and to discuss fertility as needed. Urine cytology collected and pending. Will notify of any positive findings and if any changes to treatment are needed.  Patient verbalized understanding and agreeable to plan.   Final Clinical Impressions(s) / UC Diagnoses   Final diagnoses:  Screen for STD (sexually transmitted disease)     Discharge Instructions     We are screening you today for gonorrhea, chlamydia and trichomonas.  Will notify you of any positive findings and if any changes to treatment are needed.   You may also monitor results on your MyChart.  Please establish with a primary care provider for further health maintenance management.    ED Prescriptions    None     Controlled Substance Prescriptions [place] Controlled Substance Registry consulted? Not Applicable   Georgetta Haber, NP 12/02/17 1337

## 2017-12-02 NOTE — ED Triage Notes (Signed)
Pt requesting std check. No symtpoms.

## 2017-12-02 NOTE — Discharge Instructions (Signed)
We are screening you today for gonorrhea, chlamydia and trichomonas.  Will notify you of any positive findings and if any changes to treatment are needed.   You may also monitor results on your MyChart.  Please establish with a primary care provider for further health maintenance management.

## 2017-12-03 LAB — URINE CYTOLOGY ANCILLARY ONLY
Chlamydia: NEGATIVE
Neisseria Gonorrhea: NEGATIVE
TRICH (WINDOWPATH): NEGATIVE
